# Patient Record
Sex: Male | Born: 1985 | Race: Black or African American | Hispanic: No | Marital: Single
Health system: Southern US, Community
[De-identification: ages and names within clinical notes are randomized; demographics above are authoritative.]

---
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
## 2004-06-17 ENCOUNTER — Emergency Department (HOSPITAL_COMMUNITY): Admission: EM | Admit: 2004-06-17 | Discharge: 2004-06-17 | Payer: Self-pay | Admitting: Emergency Medicine

## 2004-12-16 ENCOUNTER — Emergency Department (HOSPITAL_COMMUNITY): Admission: EM | Admit: 2004-12-16 | Discharge: 2004-12-16 | Payer: Self-pay | Admitting: Emergency Medicine

## 2005-07-01 ENCOUNTER — Emergency Department (HOSPITAL_COMMUNITY): Admission: EM | Admit: 2005-07-01 | Discharge: 2005-07-01 | Payer: Self-pay | Admitting: Emergency Medicine

## 2007-03-14 ENCOUNTER — Inpatient Hospital Stay (HOSPITAL_COMMUNITY): Admission: AC | Admit: 2007-03-14 | Discharge: 2007-03-15 | Payer: Self-pay

## 2007-04-21 ENCOUNTER — Emergency Department (HOSPITAL_COMMUNITY): Admission: EM | Admit: 2007-04-21 | Discharge: 2007-04-21 | Payer: Self-pay | Admitting: Emergency Medicine

## 2007-10-08 ENCOUNTER — Inpatient Hospital Stay (HOSPITAL_COMMUNITY): Admission: AC | Admit: 2007-10-08 | Discharge: 2007-10-17 | Payer: Self-pay

## 2007-10-18 ENCOUNTER — Inpatient Hospital Stay (HOSPITAL_COMMUNITY): Admission: EM | Admit: 2007-10-18 | Discharge: 2007-10-23 | Payer: Self-pay | Admitting: General Surgery

## 2007-11-01 ENCOUNTER — Inpatient Hospital Stay (HOSPITAL_COMMUNITY): Admission: EM | Admit: 2007-11-01 | Discharge: 2007-11-08 | Payer: Self-pay | Admitting: Emergency Medicine

## 2008-01-06 ENCOUNTER — Emergency Department (HOSPITAL_COMMUNITY): Admission: EM | Admit: 2008-01-06 | Discharge: 2008-01-06 | Payer: Self-pay | Admitting: Emergency Medicine
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
## 2010-07-10 ENCOUNTER — Encounter: Payer: Self-pay | Admitting: Emergency Medicine

## 2010-07-10 ENCOUNTER — Encounter: Payer: Self-pay | Admitting: General Surgery
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
## 2011-03-14 LAB — BASIC METABOLIC PANEL
BUN: 4 — ABNORMAL LOW
BUN: 4 — ABNORMAL LOW
BUN: 6
BUN: 9
BUN: 9
CO2: 20
CO2: 27
CO2: 30
CO2: 33 — ABNORMAL HIGH
Calcium: 7.6 — ABNORMAL LOW
Calcium: 8.3 — ABNORMAL LOW
Calcium: 8.4
Calcium: 8.7
Chloride: 100
Chloride: 101
Chloride: 107
Chloride: 93 — ABNORMAL LOW
Chloride: 98
Creatinine, Ser: 0.92
Creatinine, Ser: 1.01
Creatinine, Ser: 1.18
Creatinine, Ser: 1.25
GFR calc Af Amer: >60
GFR calc Af Amer: >60
GFR calc non Af Amer: >60
GFR calc non Af Amer: >60
GFR calc non Af Amer: >60
Glucose, Bld: 112 — ABNORMAL HIGH
Glucose, Bld: 117 — ABNORMAL HIGH
Glucose, Bld: 120 — ABNORMAL HIGH
Glucose, Bld: 121 — ABNORMAL HIGH
Glucose, Bld: 121 — ABNORMAL HIGH
Glucose, Bld: 122 — ABNORMAL HIGH
Glucose, Bld: 137 — ABNORMAL HIGH
Potassium: 3 — ABNORMAL LOW
Potassium: 3.1 — ABNORMAL LOW
Potassium: 3.3 — ABNORMAL LOW
Potassium: 3.5
Sodium: 130 — ABNORMAL LOW
Sodium: 137
Sodium: 138

## 2011-03-14 LAB — CBC
HCT: 29.6 — ABNORMAL LOW
HCT: 30.4 — ABNORMAL LOW
HCT: 39.3
HCT: 44.2
Hemoglobin: 10.4 — ABNORMAL LOW
Hemoglobin: 10.4 — ABNORMAL LOW
Hemoglobin: 14.8
Hemoglobin: 9.9 — ABNORMAL LOW
MCHC: 33.5
MCHC: 33.7
MCHC: 34.1
MCHC: 34.2
MCHC: 35
MCV: 88.3
MCV: 89.4
MCV: 89.8
MCV: 89.9
MCV: 90
Platelets: 163
Platelets: 179
Platelets: 220
Platelets: 236
Platelets: 242
Platelets: 305
RBC: 3.36 — ABNORMAL LOW
RBC: 4.26
RBC: 4.94
RDW: 13.5
RDW: 13.6
RDW: 13.8
RDW: 13.9
RDW: 14.1
WBC: 13.3 — ABNORMAL HIGH
WBC: 7.9
WBC: 7.9
WBC: 8.3
WBC: 8.4

## 2011-03-14 LAB — TYPE AND SCREEN
ABO/RH(D): O POS
Antibody Screen: NEGATIVE

## 2011-03-14 LAB — PREPARE FRESH FROZEN PLASMA

## 2011-03-14 LAB — POCT I-STAT, CHEM 8
Calcium, Ion: 1.02 — ABNORMAL LOW
Chloride: 105
HCT: 46
Potassium: 3.1 — ABNORMAL LOW

## 2011-03-14 LAB — PROTIME-INR
INR: 1
Prothrombin Time: 13.2

## 2011-03-14 LAB — DIFFERENTIAL
Basophils Absolute: 0
Basophils Absolute: 0.1
Basophils Relative: 0
Basophils Relative: 1
Eosinophils Absolute: 0
Eosinophils Absolute: 0
Eosinophils Absolute: 0.1
Eosinophils Relative: 0
Lymphs Abs: 1
Monocytes Absolute: 0.6
Monocytes Relative: 11
Monocytes Relative: 8
Neutro Abs: 11.2 — ABNORMAL HIGH
Neutro Abs: 6.9
Neutrophils Relative %: 73
Neutrophils Relative %: 84 — ABNORMAL HIGH

## 2011-03-14 LAB — ABO/RH: ABO/RH(D): O POS

## 2011-03-14 LAB — AMYLASE, BODY FLUID: Amylase, Fluid: 2720

## 2011-03-14 LAB — HEMOGLOBIN AND HEMATOCRIT, BLOOD
HCT: 39.7
Hemoglobin: 13.2
Hemoglobin: 13.5

## 2011-03-15 LAB — CBC
HCT: 37.2 — ABNORMAL LOW
MCHC: 33.2
MCV: 86
Platelets: 408 — ABNORMAL HIGH
Platelets: 462 — ABNORMAL HIGH
Platelets: 531 — ABNORMAL HIGH
RBC: 3.91 — ABNORMAL LOW
RDW: 13.8
RDW: 14.2
RDW: 14.5
WBC: 10.3
WBC: 7.5

## 2011-03-15 LAB — COMPREHENSIVE METABOLIC PANEL
AST: 20
AST: 29
Albumin: 2.9 — ABNORMAL LOW
Albumin: 3.1 — ABNORMAL LOW
Alkaline Phosphatase: 70
Alkaline Phosphatase: 72
BUN: 5 — ABNORMAL LOW
Chloride: 100
Chloride: 103
Creatinine, Ser: 0.97
GFR calc Af Amer: >60
GFR calc Af Amer: >60
Potassium: 3.6
Potassium: 4
Sodium: 136
Total Bilirubin: 0.4
Total Bilirubin: 0.9
Total Protein: 6.8
Total Protein: 7.2

## 2011-03-15 LAB — CULTURE, ROUTINE-ABSCESS

## 2011-03-15 LAB — DIFFERENTIAL
Basophils Absolute: 0.1
Eosinophils Relative: 1
Lymphocytes Relative: 19
Monocytes Absolute: 1
Monocytes Relative: 10
Neutro Abs: 7.1

## 2011-03-15 LAB — PROTIME-INR: INR: 1.1

## 2011-03-15 LAB — AMYLASE: Amylase: 45

## 2011-03-17 LAB — URINALYSIS, ROUTINE W REFLEX MICROSCOPIC
Glucose, UA: NEGATIVE
Hgb urine dipstick: NEGATIVE
Ketones, ur: NEGATIVE
pH: 7.5

## 2011-03-17 LAB — CBC
HCT: 41.5
Hemoglobin: 13.6
MCV: 80.5
RDW: 17.9 — ABNORMAL HIGH
WBC: 4.6

## 2011-03-17 LAB — COMPREHENSIVE METABOLIC PANEL
Alkaline Phosphatase: 86
BUN: 7
Chloride: 104
Creatinine, Ser: 0.99
GFR calc non Af Amer: >60
Glucose, Bld: 105 — ABNORMAL HIGH
Potassium: 3.4 — ABNORMAL LOW
Total Bilirubin: 0.8

## 2011-03-17 LAB — DIFFERENTIAL
Basophils Absolute: 0
Basophils Relative: 1
Neutro Abs: 2.2
Neutrophils Relative %: 49

## 2011-03-17 LAB — LIPASE, BLOOD: Lipase: 25

## 2011-03-30 LAB — POCT I-STAT CREATININE
Creatinine, Ser: 1.3
Operator id: 288831

## 2011-03-30 LAB — I-STAT 8, (EC8 V) (CONVERTED LAB)
Acid-base deficit: 1
Operator id: 288831
Potassium: 3.9
Sodium: 139
TCO2: 25
pH, Ven: 7.37 — ABNORMAL HIGH

## 2011-03-30 LAB — CBC
MCHC: 33.9
RBC: 5.05
WBC: 11.3 — ABNORMAL HIGH

## 2015-01-26 ENCOUNTER — Emergency Department (HOSPITAL_COMMUNITY)
Admission: EM | Admit: 2015-01-26 | Discharge: 2015-02-18 | Disposition: E | Payer: Self-pay | Attending: Emergency Medicine | Admitting: Emergency Medicine

## 2015-01-26 ENCOUNTER — Encounter (HOSPITAL_COMMUNITY): Payer: Self-pay | Admitting: Emergency Medicine

## 2015-01-26 DIAGNOSIS — Y9389 Activity, other specified: Secondary | ICD-10-CM | POA: Insufficient documentation

## 2015-01-26 DIAGNOSIS — Y9289 Other specified places as the place of occurrence of the external cause: Secondary | ICD-10-CM | POA: Insufficient documentation

## 2015-01-26 DIAGNOSIS — S51801A Unspecified open wound of right forearm, initial encounter: Secondary | ICD-10-CM | POA: Insufficient documentation

## 2015-01-26 DIAGNOSIS — W3400XA Accidental discharge from unspecified firearms or gun, initial encounter: Secondary | ICD-10-CM | POA: Insufficient documentation

## 2015-01-26 DIAGNOSIS — I469 Cardiac arrest, cause unspecified: Secondary | ICD-10-CM | POA: Insufficient documentation

## 2015-01-26 DIAGNOSIS — S31000A Unspecified open wound of lower back and pelvis without penetration into retroperitoneum, initial encounter: Secondary | ICD-10-CM | POA: Insufficient documentation

## 2015-01-26 DIAGNOSIS — Y998 Other external cause status: Secondary | ICD-10-CM | POA: Insufficient documentation

## 2015-01-26 DIAGNOSIS — R451 Restlessness and agitation: Secondary | ICD-10-CM | POA: Insufficient documentation

## 2015-01-26 LAB — PREPARE FRESH FROZEN PLASMA: UNIT DIVISION: 0

## 2015-01-26 LAB — I-STAT CG4 LACTIC ACID, ED: LACTIC ACID, VENOUS: 3.22 mmol/L — AB (ref 0.5–2.0)

## 2015-01-26 LAB — I-STAT TROPONIN, ED: TROPONIN I, POC: 0 ng/mL (ref 0.00–0.08)

## 2015-01-26 MED ORDER — SODIUM CHLORIDE 0.9 % IV SOLN
INTRAVENOUS | Status: AC | PRN
  Administered 2015-01-26: 1000 mL/h via INTRAVENOUS
  Administered 2015-01-26: 1000 mL via INTRAVENOUS

## 2015-01-26 MED ORDER — SODIUM BICARBONATE 8.4 % IV SOLN
INTRAVENOUS | Status: AC | PRN
  Administered 2015-01-26 (×2): 50 meq via INTRAVENOUS

## 2015-01-26 MED ORDER — EPINEPHRINE HCL 0.1 MG/ML IJ SOSY
PREFILLED_SYRINGE | INTRAMUSCULAR | Status: AC | PRN
  Administered 2015-01-26 (×4): 1 mg via INTRAVENOUS
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
## 2015-01-26 NOTE — Procedures (Signed)
Emergent right femoral Cordis placed via Seldinger technique. The catheter was easily flushed and aspirated. The catheter was sutured to the skin using 2-0 silk.
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
## 2015-01-26 NOTE — Code Documentation (Addendum)
1st Litre of NS complete
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
## 2015-01-26 NOTE — Code Documentation (Signed)
Patient time of death occurred at 2140
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
## 2015-02-18 DEATH — deceased
